# Patient Record
Sex: Male | Born: 1945 | Race: White | Hispanic: No | Marital: Married
Health system: Southern US, Community
[De-identification: ages and names within clinical notes are randomized; demographics above are authoritative.]

---

## 2004-01-18 ENCOUNTER — Emergency Department (HOSPITAL_COMMUNITY): Admission: EM | Admit: 2004-01-18 | Discharge: 2004-01-18 | Payer: Self-pay | Admitting: Emergency Medicine

## 2006-05-15 IMAGING — CT CT NECK W/ CM
1 of 2 series · 9 of 14 positions shown, 12 images · IV contrast (agent unspecified)
Comparison: None.

CLINICAL DATA: Allergic reaction.  Difficulty swallowing after taking amoxicillin today.  

 NECK CT WITH CONTRAST, 01/18/04
TECHNIQUE: Contiguous 3 mm axial images were obtained through the skull base through the aortic arch after administration of 150 cc of nonionic intravenous contrast.

[Series 5: — · axial · 0.39mm/px · z∈[-246,-39]mm · 9 of 87 slices shown, 12 images]
[im 9/87  soft-tissue]
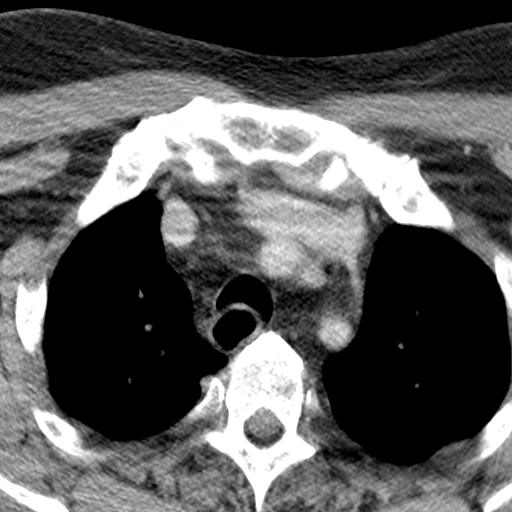
[im 9/87  bone]
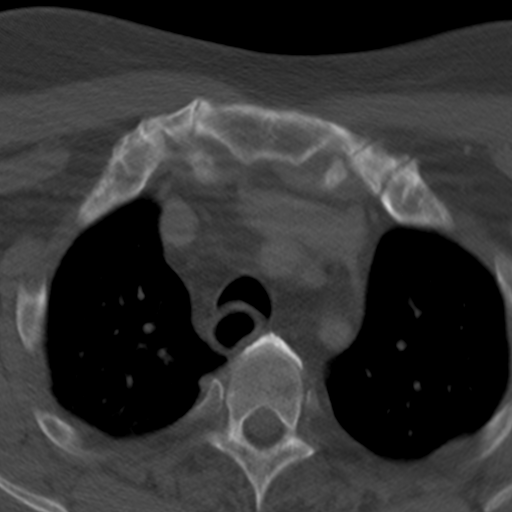
[im 18/87  bone]
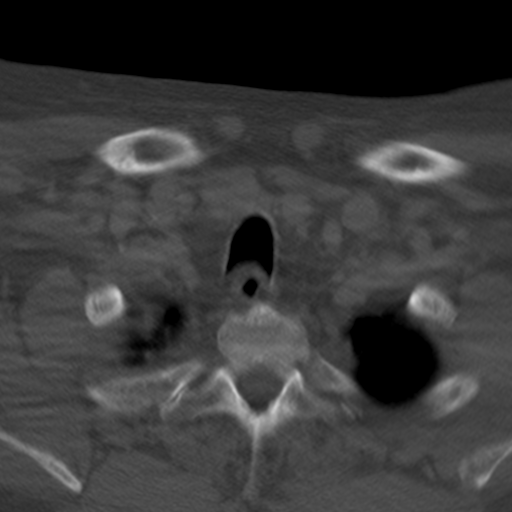
[im 26/87  bone]
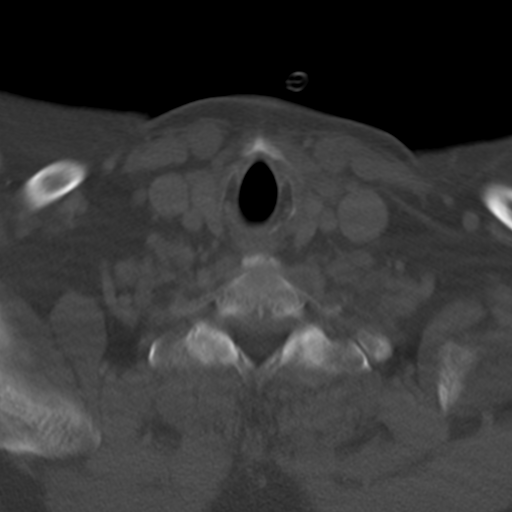
[im 35/87  bone]
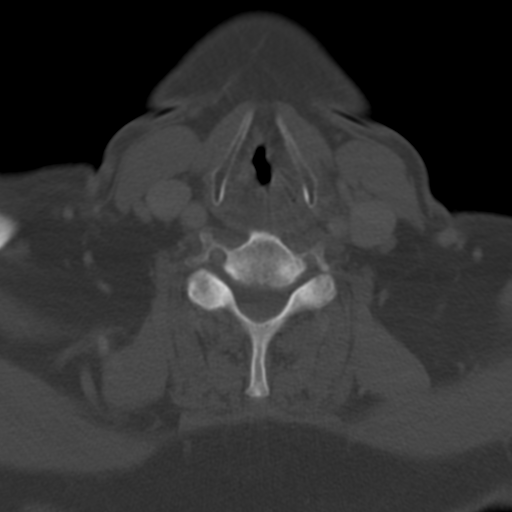
[im 44/87  soft-tissue]
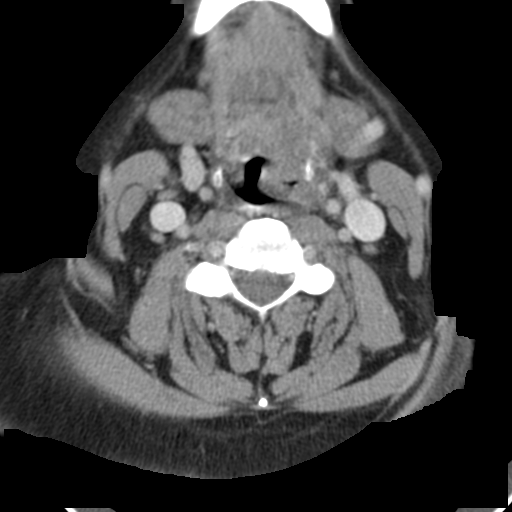
[im 44/87  bone]
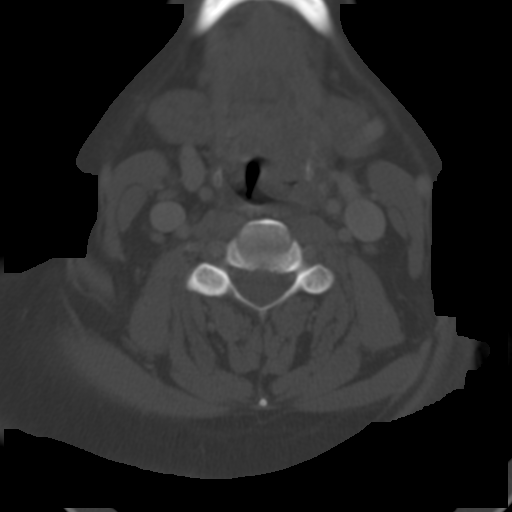
[im 52/87  bone]
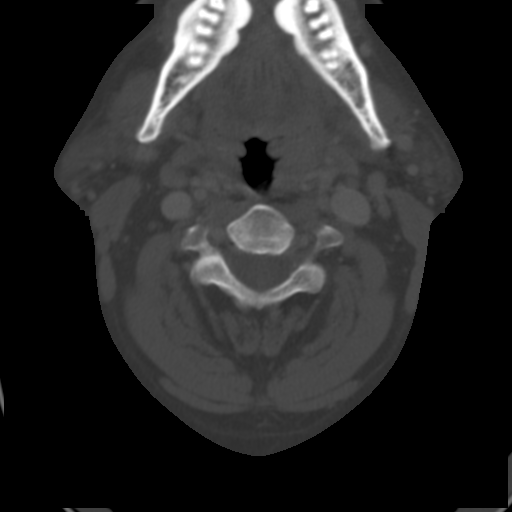
[im 61/87  bone]
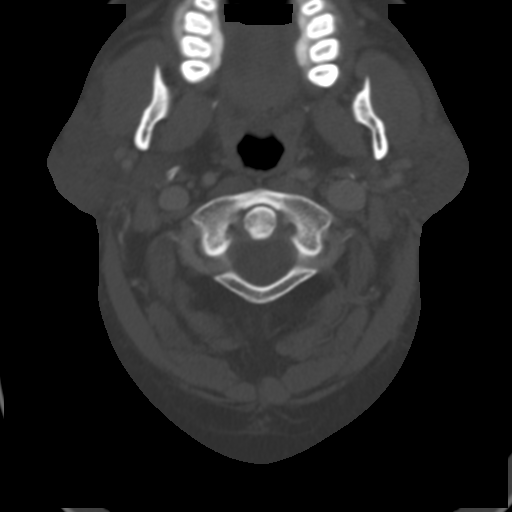
[im 69/87  bone]
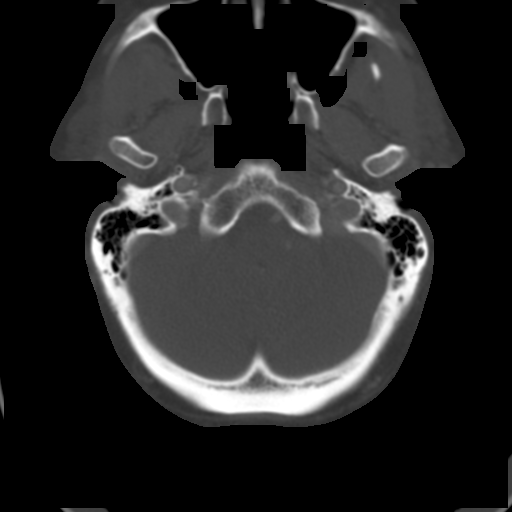
[im 78/87  soft-tissue]
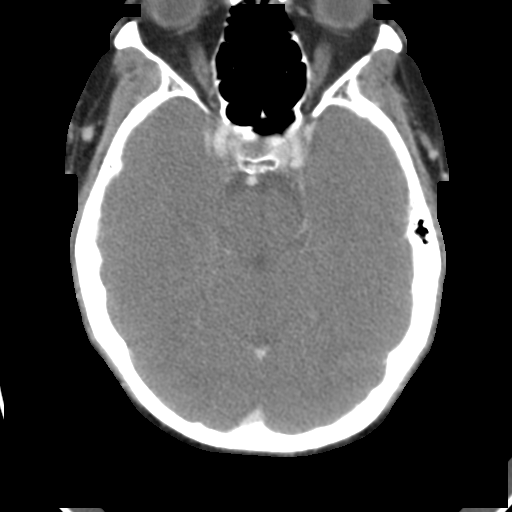
[im 78/87  bone]
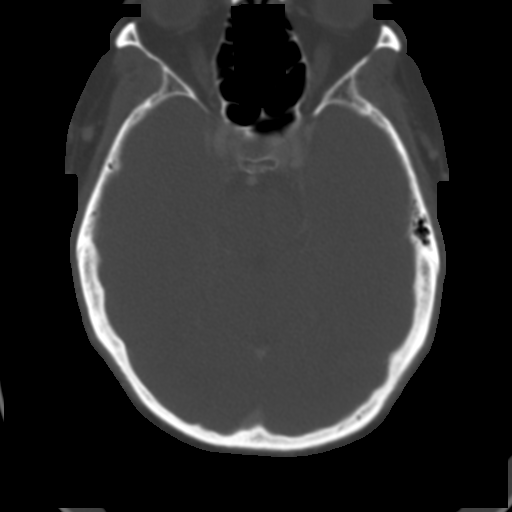

[9 of 14 positions shown; findings below may reference images not displayed]

FINDINGS: There is asymmetry of the oropharyngeal mucosa being more bulky on the left than on the right.  There appears to be a calcification within the left oropharynx just above the level of the epiglottis.  There may be some edema of the epiglottis itself and the aryepiglottic folds appear thickened, left greater than right.  No evidence for a retropharyngeal or parapharyngeal abscess.  No lymphadenopathy is identified in the neck. 

 Motion at the level of the glottis and hypopharyngeal region degrades image quality.
IMPRESSION: Study is limited by motion in the oropharynx and region of the hypopharynx.  However, there does appear to be asymmetric thickening and edema associated with the tissues of the left oropharynx extending inferiorly into the aryepiglottic folds and anterior to the epiglottis itself.  A punctate calcification in the left oropharynx may represent trapped debris but soft tissue calcification is not excluded. 

 [REDACTED]

## 2013-10-26 ENCOUNTER — Other Ambulatory Visit: Payer: Self-pay | Admitting: Family Medicine

## 2013-10-26 DIAGNOSIS — I714 Abdominal aortic aneurysm, without rupture, unspecified: Secondary | ICD-10-CM

## 2013-10-26 DIAGNOSIS — F17200 Nicotine dependence, unspecified, uncomplicated: Secondary | ICD-10-CM

## 2013-11-02 ENCOUNTER — Ambulatory Visit
Admission: RE | Admit: 2013-11-02 | Discharge: 2013-11-02 | Disposition: A | Discharge status: Home / Self Care | Payer: Medicare Other | Source: Ambulatory Visit | Attending: Family Medicine | Admitting: Family Medicine

## 2013-11-02 DIAGNOSIS — I714 Abdominal aortic aneurysm, without rupture, unspecified: Secondary | ICD-10-CM

## 2013-11-02 DIAGNOSIS — F17200 Nicotine dependence, unspecified, uncomplicated: Secondary | ICD-10-CM

## 2013-12-03 ENCOUNTER — Ambulatory Visit
Admission: RE | Admit: 2013-12-03 | Discharge: 2013-12-03 | Disposition: A | Discharge status: Home / Self Care | Payer: Medicare Other | Source: Ambulatory Visit | Attending: Gastroenterology | Admitting: Gastroenterology

## 2013-12-03 ENCOUNTER — Other Ambulatory Visit: Payer: Self-pay | Admitting: Gastroenterology

## 2013-12-03 DIAGNOSIS — R140 Abdominal distension (gaseous): Secondary | ICD-10-CM

## 2016-02-28 IMAGING — US US AORTA SCREENING (MEDICARE)
1 series · 12 of 12 positions shown · non-contrast
Comparison: None.

CLINICAL DATA: Medicare screening exam for abdominal aortic
aneurysm.

EXAM:
ABDOMINAL AORTA SCREENING ULTRASOUND
TECHNIQUE: Ultrasound examination of the abdominal aorta was performed as a
screening evaluation for abdominal aortic aneurysm.

[Series 1: us aorta screening (medicare) · 0.40mm/px · 12 of 12 slices shown]
[im 1/12]
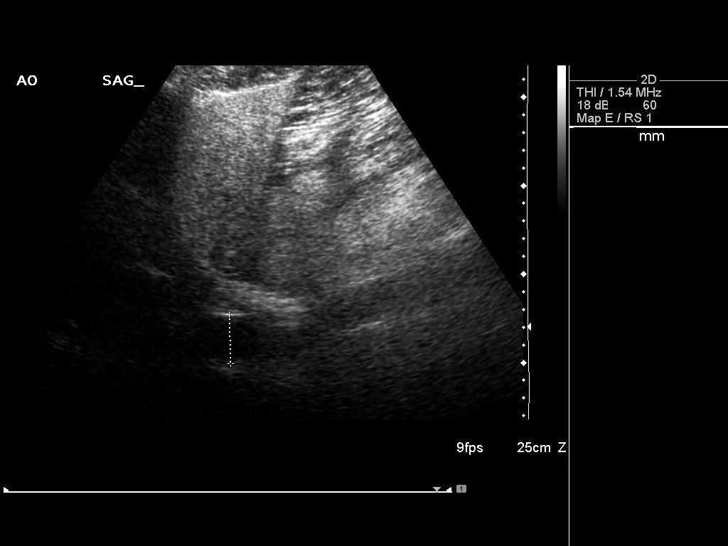
[im 2/12]
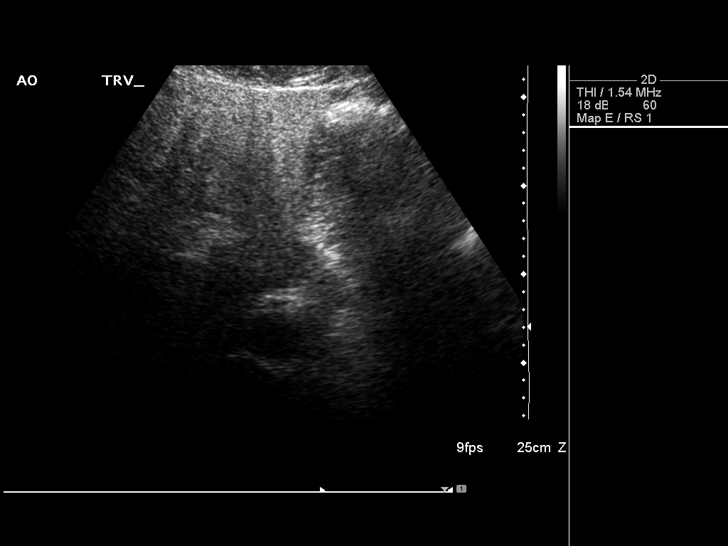
[im 3/12]
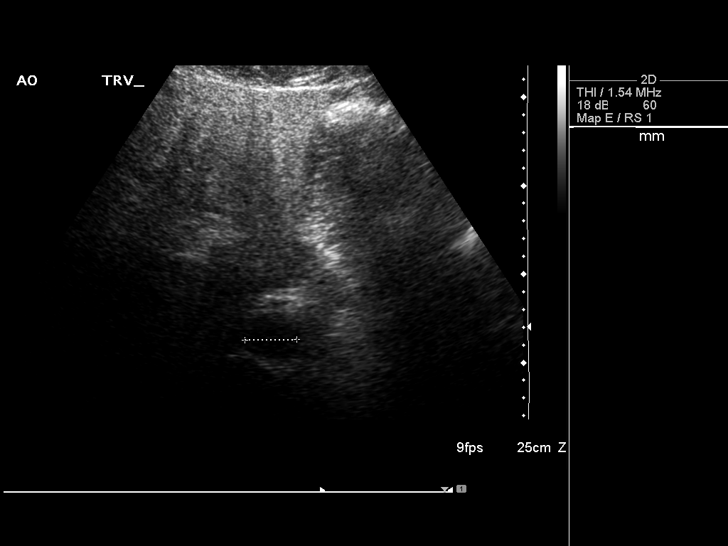
[im 4/12]
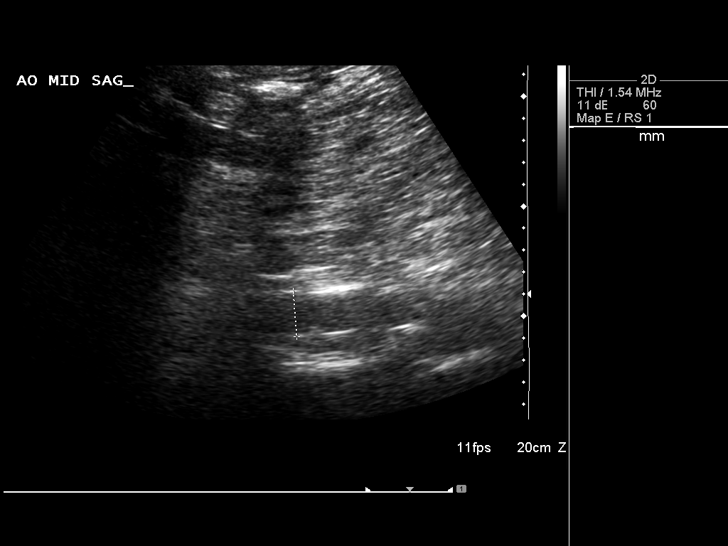
[im 5/12]
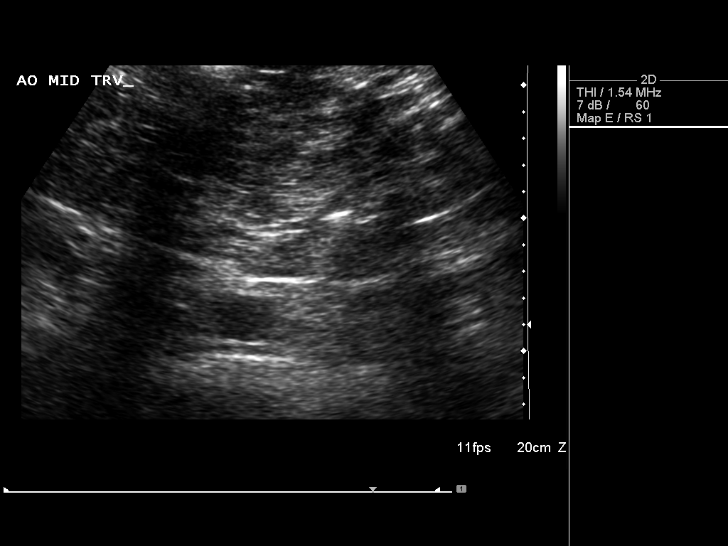
[im 6/12]
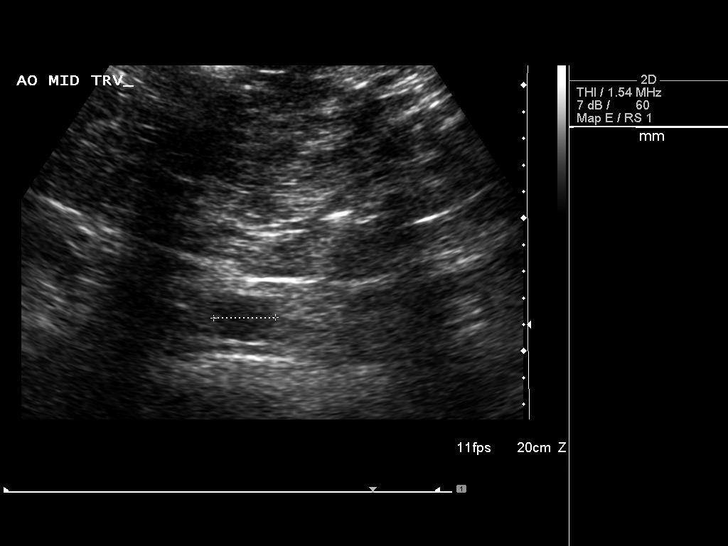
[im 7/12]
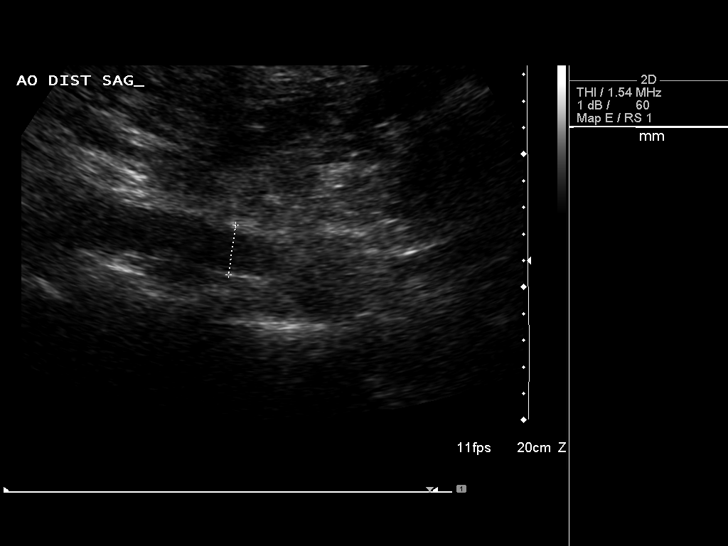
[im 8/12]
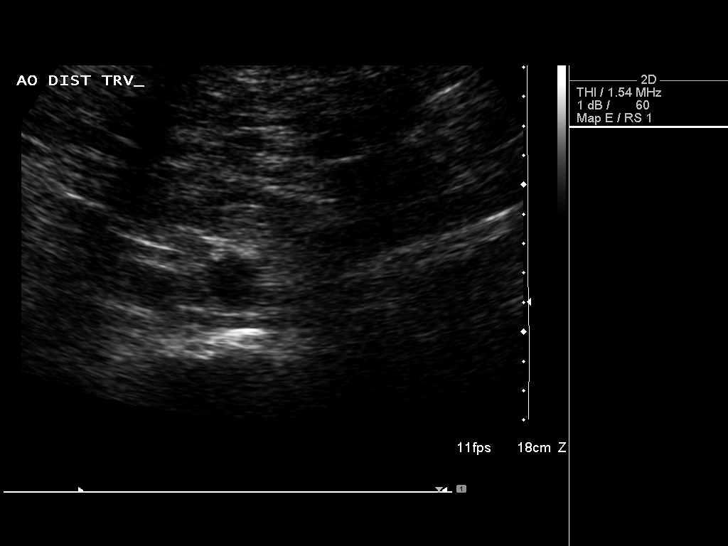
[im 9/12]
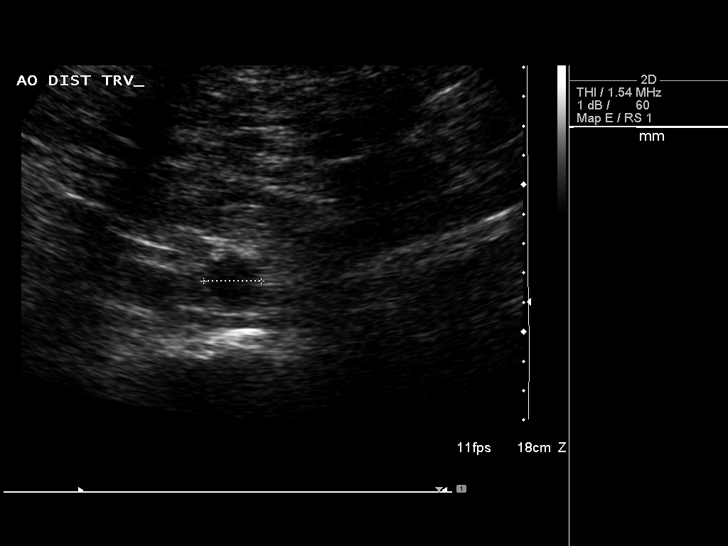
[im 10/12]
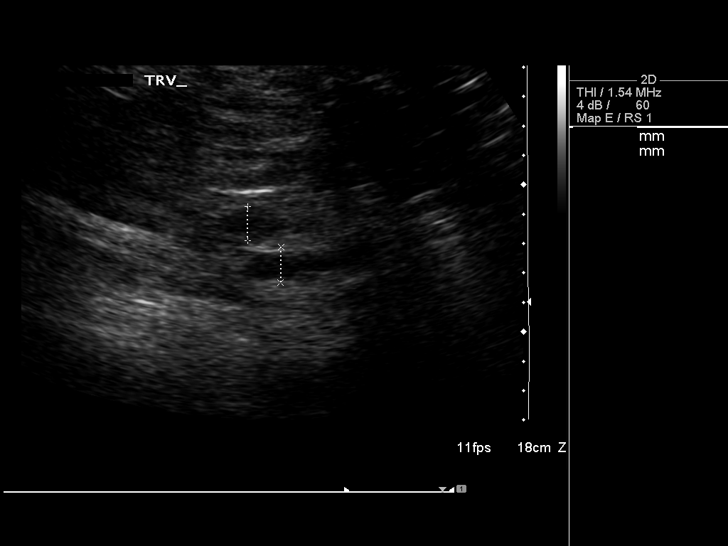
[im 11/12]
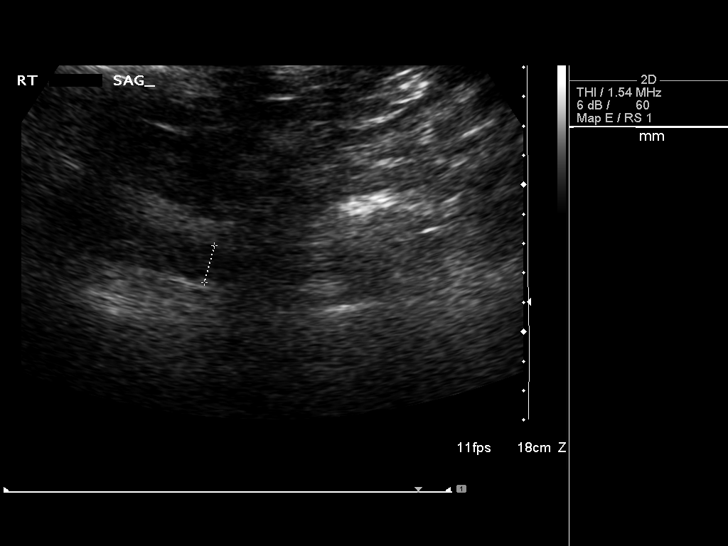
[im 12/12]
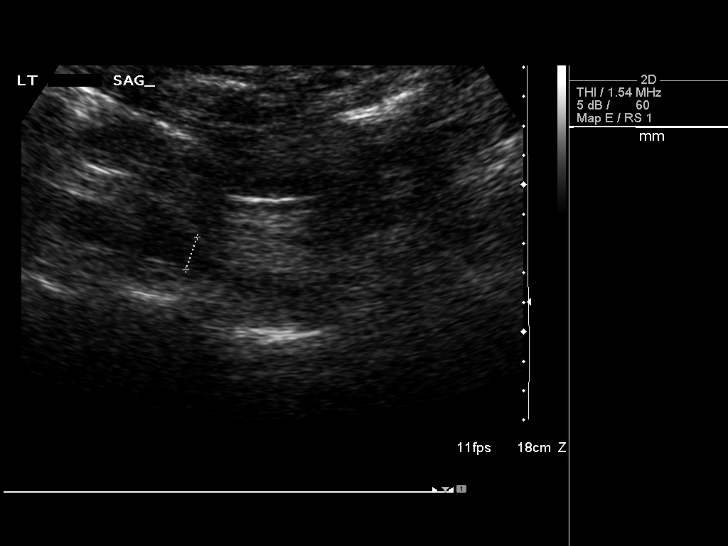

[12 of 12 positions shown; findings below may reference images not displayed]

FINDINGS: Abdominal Aorta

No aneurysm identified.

Maximum AP

Diameter:  2.8 cm in the proximal abdominal aorta.

Maximum TRV

Diameter: 2.9 cm in the proximal abdominal aorta.

The common iliac arteries are within normal limits for caliber.
IMPRESSION: Negative exam.  Abdominal aorta is within normal limits for caliber.

## 2016-11-05 DIAGNOSIS — Z125 Encounter for screening for malignant neoplasm of prostate: Secondary | ICD-10-CM | POA: Diagnosis not present

## 2016-11-05 DIAGNOSIS — Z23 Encounter for immunization: Secondary | ICD-10-CM | POA: Diagnosis not present

## 2016-11-05 DIAGNOSIS — M25561 Pain in right knee: Secondary | ICD-10-CM | POA: Diagnosis not present

## 2016-11-05 DIAGNOSIS — Z1389 Encounter for screening for other disorder: Secondary | ICD-10-CM | POA: Diagnosis not present

## 2016-11-05 DIAGNOSIS — E782 Mixed hyperlipidemia: Secondary | ICD-10-CM | POA: Diagnosis not present

## 2016-11-05 DIAGNOSIS — D696 Thrombocytopenia, unspecified: Secondary | ICD-10-CM | POA: Diagnosis not present

## 2016-11-05 DIAGNOSIS — N529 Male erectile dysfunction, unspecified: Secondary | ICD-10-CM | POA: Diagnosis not present

## 2016-11-05 DIAGNOSIS — Z6834 Body mass index (BMI) 34.0-34.9, adult: Secondary | ICD-10-CM | POA: Diagnosis not present

## 2016-11-05 DIAGNOSIS — Z0000 Encounter for general adult medical examination without abnormal findings: Secondary | ICD-10-CM | POA: Diagnosis not present

## 2016-11-12 DIAGNOSIS — H5213 Myopia, bilateral: Secondary | ICD-10-CM | POA: Diagnosis not present

## 2017-11-10 DIAGNOSIS — Z1389 Encounter for screening for other disorder: Secondary | ICD-10-CM | POA: Diagnosis not present

## 2017-11-10 DIAGNOSIS — E782 Mixed hyperlipidemia: Secondary | ICD-10-CM | POA: Diagnosis not present

## 2017-11-10 DIAGNOSIS — M25561 Pain in right knee: Secondary | ICD-10-CM | POA: Diagnosis not present

## 2017-11-10 DIAGNOSIS — Z6834 Body mass index (BMI) 34.0-34.9, adult: Secondary | ICD-10-CM | POA: Diagnosis not present

## 2017-11-10 DIAGNOSIS — Z125 Encounter for screening for malignant neoplasm of prostate: Secondary | ICD-10-CM | POA: Diagnosis not present

## 2017-11-10 DIAGNOSIS — D696 Thrombocytopenia, unspecified: Secondary | ICD-10-CM | POA: Diagnosis not present

## 2017-11-10 DIAGNOSIS — Z0000 Encounter for general adult medical examination without abnormal findings: Secondary | ICD-10-CM | POA: Diagnosis not present

## 2017-11-10 DIAGNOSIS — N529 Male erectile dysfunction, unspecified: Secondary | ICD-10-CM | POA: Diagnosis not present

## 2017-11-25 DIAGNOSIS — H5213 Myopia, bilateral: Secondary | ICD-10-CM | POA: Diagnosis not present

## 2017-11-25 DIAGNOSIS — H2513 Age-related nuclear cataract, bilateral: Secondary | ICD-10-CM | POA: Diagnosis not present

## 2017-11-25 DIAGNOSIS — H52223 Regular astigmatism, bilateral: Secondary | ICD-10-CM | POA: Diagnosis not present

## 2018-02-02 DIAGNOSIS — H25043 Posterior subcapsular polar age-related cataract, bilateral: Secondary | ICD-10-CM | POA: Diagnosis not present

## 2018-02-02 DIAGNOSIS — H25013 Cortical age-related cataract, bilateral: Secondary | ICD-10-CM | POA: Diagnosis not present

## 2018-02-02 DIAGNOSIS — H52223 Regular astigmatism, bilateral: Secondary | ICD-10-CM | POA: Diagnosis not present

## 2018-02-02 DIAGNOSIS — H2512 Age-related nuclear cataract, left eye: Secondary | ICD-10-CM | POA: Diagnosis not present

## 2018-02-02 DIAGNOSIS — H2513 Age-related nuclear cataract, bilateral: Secondary | ICD-10-CM | POA: Diagnosis not present

## 2018-02-10 DIAGNOSIS — H2512 Age-related nuclear cataract, left eye: Secondary | ICD-10-CM | POA: Diagnosis not present

## 2018-02-10 DIAGNOSIS — H25812 Combined forms of age-related cataract, left eye: Secondary | ICD-10-CM | POA: Diagnosis not present

## 2018-02-11 DIAGNOSIS — H25041 Posterior subcapsular polar age-related cataract, right eye: Secondary | ICD-10-CM | POA: Diagnosis not present

## 2018-02-11 DIAGNOSIS — H2511 Age-related nuclear cataract, right eye: Secondary | ICD-10-CM | POA: Diagnosis not present

## 2018-02-11 DIAGNOSIS — H25011 Cortical age-related cataract, right eye: Secondary | ICD-10-CM | POA: Diagnosis not present

## 2018-03-03 DIAGNOSIS — H2511 Age-related nuclear cataract, right eye: Secondary | ICD-10-CM | POA: Diagnosis not present

## 2018-03-03 DIAGNOSIS — H25811 Combined forms of age-related cataract, right eye: Secondary | ICD-10-CM | POA: Diagnosis not present

## 2018-03-10 DIAGNOSIS — H2511 Age-related nuclear cataract, right eye: Secondary | ICD-10-CM | POA: Diagnosis not present

## 2018-11-12 DIAGNOSIS — Z125 Encounter for screening for malignant neoplasm of prostate: Secondary | ICD-10-CM | POA: Diagnosis not present

## 2018-11-12 DIAGNOSIS — D696 Thrombocytopenia, unspecified: Secondary | ICD-10-CM | POA: Diagnosis not present

## 2018-11-12 DIAGNOSIS — E782 Mixed hyperlipidemia: Secondary | ICD-10-CM | POA: Diagnosis not present

## 2018-11-17 DIAGNOSIS — Z0000 Encounter for general adult medical examination without abnormal findings: Secondary | ICD-10-CM | POA: Diagnosis not present

## 2018-11-17 DIAGNOSIS — N529 Male erectile dysfunction, unspecified: Secondary | ICD-10-CM | POA: Diagnosis not present

## 2018-11-17 DIAGNOSIS — E782 Mixed hyperlipidemia: Secondary | ICD-10-CM | POA: Diagnosis not present

## 2018-11-17 DIAGNOSIS — Z1389 Encounter for screening for other disorder: Secondary | ICD-10-CM | POA: Diagnosis not present

## 2018-11-17 DIAGNOSIS — M25561 Pain in right knee: Secondary | ICD-10-CM | POA: Diagnosis not present

## 2018-11-17 DIAGNOSIS — D696 Thrombocytopenia, unspecified: Secondary | ICD-10-CM | POA: Diagnosis not present

## 2018-11-17 DIAGNOSIS — Z6834 Body mass index (BMI) 34.0-34.9, adult: Secondary | ICD-10-CM | POA: Diagnosis not present

## 2019-01-25 DIAGNOSIS — D485 Neoplasm of uncertain behavior of skin: Secondary | ICD-10-CM | POA: Diagnosis not present

## 2019-01-25 DIAGNOSIS — E7800 Pure hypercholesterolemia, unspecified: Secondary | ICD-10-CM | POA: Diagnosis not present

## 2019-08-12 DIAGNOSIS — R69 Illness, unspecified: Secondary | ICD-10-CM | POA: Diagnosis not present

## 2019-09-07 DIAGNOSIS — R69 Illness, unspecified: Secondary | ICD-10-CM | POA: Diagnosis not present

## 2019-11-22 DIAGNOSIS — G479 Sleep disorder, unspecified: Secondary | ICD-10-CM | POA: Diagnosis not present

## 2019-11-22 DIAGNOSIS — Z125 Encounter for screening for malignant neoplasm of prostate: Secondary | ICD-10-CM | POA: Diagnosis not present

## 2019-11-22 DIAGNOSIS — Z1389 Encounter for screening for other disorder: Secondary | ICD-10-CM | POA: Diagnosis not present

## 2019-11-22 DIAGNOSIS — D696 Thrombocytopenia, unspecified: Secondary | ICD-10-CM | POA: Diagnosis not present

## 2019-11-22 DIAGNOSIS — Z0000 Encounter for general adult medical examination without abnormal findings: Secondary | ICD-10-CM | POA: Diagnosis not present

## 2019-11-22 DIAGNOSIS — N529 Male erectile dysfunction, unspecified: Secondary | ICD-10-CM | POA: Diagnosis not present

## 2019-11-22 DIAGNOSIS — E782 Mixed hyperlipidemia: Secondary | ICD-10-CM | POA: Diagnosis not present

## 2019-11-22 DIAGNOSIS — M25561 Pain in right knee: Secondary | ICD-10-CM | POA: Diagnosis not present

## 2019-12-16 DIAGNOSIS — E875 Hyperkalemia: Secondary | ICD-10-CM | POA: Diagnosis not present

## 2020-11-27 DIAGNOSIS — M25561 Pain in right knee: Secondary | ICD-10-CM | POA: Diagnosis not present

## 2020-11-27 DIAGNOSIS — Z0000 Encounter for general adult medical examination without abnormal findings: Secondary | ICD-10-CM | POA: Diagnosis not present

## 2020-11-27 DIAGNOSIS — G479 Sleep disorder, unspecified: Secondary | ICD-10-CM | POA: Diagnosis not present

## 2020-11-27 DIAGNOSIS — Z1389 Encounter for screening for other disorder: Secondary | ICD-10-CM | POA: Diagnosis not present

## 2020-11-27 DIAGNOSIS — N529 Male erectile dysfunction, unspecified: Secondary | ICD-10-CM | POA: Diagnosis not present

## 2020-11-27 DIAGNOSIS — D696 Thrombocytopenia, unspecified: Secondary | ICD-10-CM | POA: Diagnosis not present

## 2020-11-27 DIAGNOSIS — Z125 Encounter for screening for malignant neoplasm of prostate: Secondary | ICD-10-CM | POA: Diagnosis not present

## 2020-11-27 DIAGNOSIS — E782 Mixed hyperlipidemia: Secondary | ICD-10-CM | POA: Diagnosis not present

## 2021-02-05 DIAGNOSIS — E782 Mixed hyperlipidemia: Secondary | ICD-10-CM | POA: Diagnosis not present

## 2021-04-24 DIAGNOSIS — E782 Mixed hyperlipidemia: Secondary | ICD-10-CM | POA: Diagnosis not present

## 2021-07-10 DIAGNOSIS — E782 Mixed hyperlipidemia: Secondary | ICD-10-CM | POA: Diagnosis not present

## 2021-07-10 DIAGNOSIS — K5904 Chronic idiopathic constipation: Secondary | ICD-10-CM | POA: Diagnosis not present

## 2021-07-10 DIAGNOSIS — H9190 Unspecified hearing loss, unspecified ear: Secondary | ICD-10-CM | POA: Diagnosis not present

## 2021-07-10 DIAGNOSIS — N529 Male erectile dysfunction, unspecified: Secondary | ICD-10-CM | POA: Diagnosis not present

## 2021-07-10 DIAGNOSIS — G259 Extrapyramidal and movement disorder, unspecified: Secondary | ICD-10-CM | POA: Diagnosis not present

## 2021-07-10 DIAGNOSIS — R3589 Other polyuria: Secondary | ICD-10-CM | POA: Diagnosis not present

## 2021-07-10 DIAGNOSIS — B351 Tinea unguium: Secondary | ICD-10-CM | POA: Diagnosis not present

## 2021-07-10 DIAGNOSIS — M48062 Spinal stenosis, lumbar region with neurogenic claudication: Secondary | ICD-10-CM | POA: Diagnosis not present

## 2021-07-13 DIAGNOSIS — M47819 Spondylosis without myelopathy or radiculopathy, site unspecified: Secondary | ICD-10-CM | POA: Diagnosis not present

## 2021-07-13 DIAGNOSIS — M5136 Other intervertebral disc degeneration, lumbar region: Secondary | ICD-10-CM | POA: Diagnosis not present

## 2022-01-02 DIAGNOSIS — Z1331 Encounter for screening for depression: Secondary | ICD-10-CM | POA: Diagnosis not present

## 2022-01-02 DIAGNOSIS — Z0000 Encounter for general adult medical examination without abnormal findings: Secondary | ICD-10-CM | POA: Diagnosis not present

## 2022-01-02 DIAGNOSIS — N529 Male erectile dysfunction, unspecified: Secondary | ICD-10-CM | POA: Diagnosis not present

## 2022-01-02 DIAGNOSIS — E782 Mixed hyperlipidemia: Secondary | ICD-10-CM | POA: Diagnosis not present

## 2022-01-02 DIAGNOSIS — G479 Sleep disorder, unspecified: Secondary | ICD-10-CM | POA: Diagnosis not present

## 2022-01-02 DIAGNOSIS — D696 Thrombocytopenia, unspecified: Secondary | ICD-10-CM | POA: Diagnosis not present

## 2022-01-02 DIAGNOSIS — M25561 Pain in right knee: Secondary | ICD-10-CM | POA: Diagnosis not present

## 2022-01-02 DIAGNOSIS — Z125 Encounter for screening for malignant neoplasm of prostate: Secondary | ICD-10-CM | POA: Diagnosis not present

## 2022-01-02 DIAGNOSIS — Z6839 Body mass index (BMI) 39.0-39.9, adult: Secondary | ICD-10-CM | POA: Diagnosis not present

## 2022-02-21 DIAGNOSIS — N529 Male erectile dysfunction, unspecified: Secondary | ICD-10-CM | POA: Diagnosis not present

## 2022-03-15 DIAGNOSIS — N529 Male erectile dysfunction, unspecified: Secondary | ICD-10-CM | POA: Diagnosis not present

## 2022-03-19 DIAGNOSIS — E7800 Pure hypercholesterolemia, unspecified: Secondary | ICD-10-CM | POA: Diagnosis not present

## 2022-10-25 DIAGNOSIS — R262 Difficulty in walking, not elsewhere classified: Secondary | ICD-10-CM | POA: Diagnosis not present

## 2022-10-25 DIAGNOSIS — M47816 Spondylosis without myelopathy or radiculopathy, lumbar region: Secondary | ICD-10-CM | POA: Diagnosis not present

## 2022-10-25 DIAGNOSIS — M48061 Spinal stenosis, lumbar region without neurogenic claudication: Secondary | ICD-10-CM | POA: Diagnosis not present

## 2022-10-31 DIAGNOSIS — D1801 Hemangioma of skin and subcutaneous tissue: Secondary | ICD-10-CM | POA: Diagnosis not present

## 2022-10-31 DIAGNOSIS — L570 Actinic keratosis: Secondary | ICD-10-CM | POA: Diagnosis not present

## 2022-10-31 DIAGNOSIS — L821 Other seborrheic keratosis: Secondary | ICD-10-CM | POA: Diagnosis not present

## 2022-11-13 DIAGNOSIS — M5459 Other low back pain: Secondary | ICD-10-CM | POA: Diagnosis not present

## 2022-11-13 DIAGNOSIS — M48061 Spinal stenosis, lumbar region without neurogenic claudication: Secondary | ICD-10-CM | POA: Diagnosis not present

## 2022-11-13 DIAGNOSIS — R2689 Other abnormalities of gait and mobility: Secondary | ICD-10-CM | POA: Diagnosis not present

## 2022-11-18 DIAGNOSIS — M5459 Other low back pain: Secondary | ICD-10-CM | POA: Diagnosis not present

## 2022-11-18 DIAGNOSIS — M48061 Spinal stenosis, lumbar region without neurogenic claudication: Secondary | ICD-10-CM | POA: Diagnosis not present

## 2022-11-18 DIAGNOSIS — R2689 Other abnormalities of gait and mobility: Secondary | ICD-10-CM | POA: Diagnosis not present

## 2022-11-21 DIAGNOSIS — M48061 Spinal stenosis, lumbar region without neurogenic claudication: Secondary | ICD-10-CM | POA: Diagnosis not present

## 2022-11-21 DIAGNOSIS — R2689 Other abnormalities of gait and mobility: Secondary | ICD-10-CM | POA: Diagnosis not present

## 2022-11-21 DIAGNOSIS — M5459 Other low back pain: Secondary | ICD-10-CM | POA: Diagnosis not present

## 2022-11-28 DIAGNOSIS — M48061 Spinal stenosis, lumbar region without neurogenic claudication: Secondary | ICD-10-CM | POA: Diagnosis not present

## 2022-11-28 DIAGNOSIS — R2689 Other abnormalities of gait and mobility: Secondary | ICD-10-CM | POA: Diagnosis not present

## 2022-11-28 DIAGNOSIS — M5459 Other low back pain: Secondary | ICD-10-CM | POA: Diagnosis not present

## 2022-12-05 DIAGNOSIS — R2689 Other abnormalities of gait and mobility: Secondary | ICD-10-CM | POA: Diagnosis not present

## 2022-12-05 DIAGNOSIS — M48061 Spinal stenosis, lumbar region without neurogenic claudication: Secondary | ICD-10-CM | POA: Diagnosis not present

## 2022-12-05 DIAGNOSIS — M5459 Other low back pain: Secondary | ICD-10-CM | POA: Diagnosis not present

## 2022-12-12 DIAGNOSIS — M48061 Spinal stenosis, lumbar region without neurogenic claudication: Secondary | ICD-10-CM | POA: Diagnosis not present

## 2022-12-12 DIAGNOSIS — R2689 Other abnormalities of gait and mobility: Secondary | ICD-10-CM | POA: Diagnosis not present

## 2022-12-12 DIAGNOSIS — M5459 Other low back pain: Secondary | ICD-10-CM | POA: Diagnosis not present

## 2022-12-19 DIAGNOSIS — M5459 Other low back pain: Secondary | ICD-10-CM | POA: Diagnosis not present

## 2022-12-19 DIAGNOSIS — M48061 Spinal stenosis, lumbar region without neurogenic claudication: Secondary | ICD-10-CM | POA: Diagnosis not present

## 2022-12-19 DIAGNOSIS — R2689 Other abnormalities of gait and mobility: Secondary | ICD-10-CM | POA: Diagnosis not present

## 2023-01-02 DIAGNOSIS — M5459 Other low back pain: Secondary | ICD-10-CM | POA: Diagnosis not present

## 2023-01-02 DIAGNOSIS — M48061 Spinal stenosis, lumbar region without neurogenic claudication: Secondary | ICD-10-CM | POA: Diagnosis not present

## 2023-01-02 DIAGNOSIS — R2689 Other abnormalities of gait and mobility: Secondary | ICD-10-CM | POA: Diagnosis not present

## 2023-01-07 DIAGNOSIS — M5459 Other low back pain: Secondary | ICD-10-CM | POA: Diagnosis not present

## 2023-01-07 DIAGNOSIS — M48061 Spinal stenosis, lumbar region without neurogenic claudication: Secondary | ICD-10-CM | POA: Diagnosis not present

## 2023-01-07 DIAGNOSIS — R2689 Other abnormalities of gait and mobility: Secondary | ICD-10-CM | POA: Diagnosis not present

## 2023-01-16 DIAGNOSIS — M48061 Spinal stenosis, lumbar region without neurogenic claudication: Secondary | ICD-10-CM | POA: Diagnosis not present

## 2023-01-16 DIAGNOSIS — R2689 Other abnormalities of gait and mobility: Secondary | ICD-10-CM | POA: Diagnosis not present

## 2023-01-16 DIAGNOSIS — M5459 Other low back pain: Secondary | ICD-10-CM | POA: Diagnosis not present

## 2023-01-21 DIAGNOSIS — M5459 Other low back pain: Secondary | ICD-10-CM | POA: Diagnosis not present

## 2023-01-21 DIAGNOSIS — M48061 Spinal stenosis, lumbar region without neurogenic claudication: Secondary | ICD-10-CM | POA: Diagnosis not present

## 2023-01-21 DIAGNOSIS — R2689 Other abnormalities of gait and mobility: Secondary | ICD-10-CM | POA: Diagnosis not present

## 2023-01-28 DIAGNOSIS — Z1159 Encounter for screening for other viral diseases: Secondary | ICD-10-CM | POA: Diagnosis not present

## 2023-01-30 DIAGNOSIS — R2689 Other abnormalities of gait and mobility: Secondary | ICD-10-CM | POA: Diagnosis not present

## 2023-01-30 DIAGNOSIS — M5459 Other low back pain: Secondary | ICD-10-CM | POA: Diagnosis not present

## 2023-01-30 DIAGNOSIS — M48061 Spinal stenosis, lumbar region without neurogenic claudication: Secondary | ICD-10-CM | POA: Diagnosis not present

## 2023-02-04 DIAGNOSIS — M48061 Spinal stenosis, lumbar region without neurogenic claudication: Secondary | ICD-10-CM | POA: Diagnosis not present

## 2023-02-04 DIAGNOSIS — R2689 Other abnormalities of gait and mobility: Secondary | ICD-10-CM | POA: Diagnosis not present

## 2023-02-04 DIAGNOSIS — M5459 Other low back pain: Secondary | ICD-10-CM | POA: Diagnosis not present

## 2023-02-10 DIAGNOSIS — R2689 Other abnormalities of gait and mobility: Secondary | ICD-10-CM | POA: Diagnosis not present

## 2023-02-10 DIAGNOSIS — M48061 Spinal stenosis, lumbar region without neurogenic claudication: Secondary | ICD-10-CM | POA: Diagnosis not present

## 2023-02-10 DIAGNOSIS — M5459 Other low back pain: Secondary | ICD-10-CM | POA: Diagnosis not present

## 2023-02-25 DIAGNOSIS — M5459 Other low back pain: Secondary | ICD-10-CM | POA: Diagnosis not present

## 2023-02-25 DIAGNOSIS — M48061 Spinal stenosis, lumbar region without neurogenic claudication: Secondary | ICD-10-CM | POA: Diagnosis not present

## 2023-02-25 DIAGNOSIS — R2689 Other abnormalities of gait and mobility: Secondary | ICD-10-CM | POA: Diagnosis not present

## 2023-03-05 DIAGNOSIS — E7800 Pure hypercholesterolemia, unspecified: Secondary | ICD-10-CM | POA: Diagnosis not present

## 2023-03-06 DIAGNOSIS — R2689 Other abnormalities of gait and mobility: Secondary | ICD-10-CM | POA: Diagnosis not present

## 2023-03-06 DIAGNOSIS — M5459 Other low back pain: Secondary | ICD-10-CM | POA: Diagnosis not present

## 2023-03-06 DIAGNOSIS — M48061 Spinal stenosis, lumbar region without neurogenic claudication: Secondary | ICD-10-CM | POA: Diagnosis not present

## 2023-03-13 DIAGNOSIS — M48061 Spinal stenosis, lumbar region without neurogenic claudication: Secondary | ICD-10-CM | POA: Diagnosis not present

## 2023-03-13 DIAGNOSIS — M5459 Other low back pain: Secondary | ICD-10-CM | POA: Diagnosis not present

## 2023-03-13 DIAGNOSIS — R2689 Other abnormalities of gait and mobility: Secondary | ICD-10-CM | POA: Diagnosis not present

## 2023-03-27 DIAGNOSIS — R2689 Other abnormalities of gait and mobility: Secondary | ICD-10-CM | POA: Diagnosis not present

## 2023-03-27 DIAGNOSIS — M48061 Spinal stenosis, lumbar region without neurogenic claudication: Secondary | ICD-10-CM | POA: Diagnosis not present

## 2023-03-27 DIAGNOSIS — M5459 Other low back pain: Secondary | ICD-10-CM | POA: Diagnosis not present

## 2023-04-03 DIAGNOSIS — M5459 Other low back pain: Secondary | ICD-10-CM | POA: Diagnosis not present

## 2023-04-03 DIAGNOSIS — M48061 Spinal stenosis, lumbar region without neurogenic claudication: Secondary | ICD-10-CM | POA: Diagnosis not present

## 2023-04-03 DIAGNOSIS — R2689 Other abnormalities of gait and mobility: Secondary | ICD-10-CM | POA: Diagnosis not present

## 2023-04-10 DIAGNOSIS — M5459 Other low back pain: Secondary | ICD-10-CM | POA: Diagnosis not present

## 2023-04-10 DIAGNOSIS — M48061 Spinal stenosis, lumbar region without neurogenic claudication: Secondary | ICD-10-CM | POA: Diagnosis not present

## 2023-04-10 DIAGNOSIS — R2689 Other abnormalities of gait and mobility: Secondary | ICD-10-CM | POA: Diagnosis not present

## 2023-04-17 DIAGNOSIS — M5459 Other low back pain: Secondary | ICD-10-CM | POA: Diagnosis not present

## 2023-04-17 DIAGNOSIS — M48061 Spinal stenosis, lumbar region without neurogenic claudication: Secondary | ICD-10-CM | POA: Diagnosis not present

## 2023-04-17 DIAGNOSIS — R2689 Other abnormalities of gait and mobility: Secondary | ICD-10-CM | POA: Diagnosis not present

## 2023-04-24 DIAGNOSIS — M5459 Other low back pain: Secondary | ICD-10-CM | POA: Diagnosis not present

## 2023-04-24 DIAGNOSIS — M48061 Spinal stenosis, lumbar region without neurogenic claudication: Secondary | ICD-10-CM | POA: Diagnosis not present

## 2023-04-24 DIAGNOSIS — R2689 Other abnormalities of gait and mobility: Secondary | ICD-10-CM | POA: Diagnosis not present

## 2023-04-30 DIAGNOSIS — M48061 Spinal stenosis, lumbar region without neurogenic claudication: Secondary | ICD-10-CM | POA: Diagnosis not present

## 2023-04-30 DIAGNOSIS — R2689 Other abnormalities of gait and mobility: Secondary | ICD-10-CM | POA: Diagnosis not present

## 2023-04-30 DIAGNOSIS — M5459 Other low back pain: Secondary | ICD-10-CM | POA: Diagnosis not present

## 2023-05-08 DIAGNOSIS — R2689 Other abnormalities of gait and mobility: Secondary | ICD-10-CM | POA: Diagnosis not present

## 2023-05-08 DIAGNOSIS — M48061 Spinal stenosis, lumbar region without neurogenic claudication: Secondary | ICD-10-CM | POA: Diagnosis not present

## 2023-05-08 DIAGNOSIS — M5459 Other low back pain: Secondary | ICD-10-CM | POA: Diagnosis not present

## 2023-05-15 DIAGNOSIS — M48061 Spinal stenosis, lumbar region without neurogenic claudication: Secondary | ICD-10-CM | POA: Diagnosis not present

## 2023-05-15 DIAGNOSIS — M5459 Other low back pain: Secondary | ICD-10-CM | POA: Diagnosis not present

## 2023-05-15 DIAGNOSIS — R2689 Other abnormalities of gait and mobility: Secondary | ICD-10-CM | POA: Diagnosis not present

## 2023-05-22 DIAGNOSIS — M48061 Spinal stenosis, lumbar region without neurogenic claudication: Secondary | ICD-10-CM | POA: Diagnosis not present

## 2023-05-22 DIAGNOSIS — M5459 Other low back pain: Secondary | ICD-10-CM | POA: Diagnosis not present

## 2023-05-22 DIAGNOSIS — R2689 Other abnormalities of gait and mobility: Secondary | ICD-10-CM | POA: Diagnosis not present

## 2023-05-29 DIAGNOSIS — M5459 Other low back pain: Secondary | ICD-10-CM | POA: Diagnosis not present

## 2023-05-29 DIAGNOSIS — M48061 Spinal stenosis, lumbar region without neurogenic claudication: Secondary | ICD-10-CM | POA: Diagnosis not present

## 2023-05-29 DIAGNOSIS — R2689 Other abnormalities of gait and mobility: Secondary | ICD-10-CM | POA: Diagnosis not present

## 2023-06-05 DIAGNOSIS — R2689 Other abnormalities of gait and mobility: Secondary | ICD-10-CM | POA: Diagnosis not present

## 2023-06-05 DIAGNOSIS — M48061 Spinal stenosis, lumbar region without neurogenic claudication: Secondary | ICD-10-CM | POA: Diagnosis not present

## 2023-06-05 DIAGNOSIS — M5459 Other low back pain: Secondary | ICD-10-CM | POA: Diagnosis not present

## 2023-06-12 DIAGNOSIS — M48061 Spinal stenosis, lumbar region without neurogenic claudication: Secondary | ICD-10-CM | POA: Diagnosis not present

## 2023-06-12 DIAGNOSIS — R2689 Other abnormalities of gait and mobility: Secondary | ICD-10-CM | POA: Diagnosis not present

## 2023-06-12 DIAGNOSIS — M5459 Other low back pain: Secondary | ICD-10-CM | POA: Diagnosis not present

## 2023-06-18 DIAGNOSIS — R2689 Other abnormalities of gait and mobility: Secondary | ICD-10-CM | POA: Diagnosis not present

## 2023-06-18 DIAGNOSIS — M48061 Spinal stenosis, lumbar region without neurogenic claudication: Secondary | ICD-10-CM | POA: Diagnosis not present

## 2023-06-18 DIAGNOSIS — M5459 Other low back pain: Secondary | ICD-10-CM | POA: Diagnosis not present

## 2023-06-25 DIAGNOSIS — M48061 Spinal stenosis, lumbar region without neurogenic claudication: Secondary | ICD-10-CM | POA: Diagnosis not present

## 2023-06-25 DIAGNOSIS — R2689 Other abnormalities of gait and mobility: Secondary | ICD-10-CM | POA: Diagnosis not present

## 2023-06-25 DIAGNOSIS — M5459 Other low back pain: Secondary | ICD-10-CM | POA: Diagnosis not present

## 2023-07-03 DIAGNOSIS — R2689 Other abnormalities of gait and mobility: Secondary | ICD-10-CM | POA: Diagnosis not present

## 2023-07-03 DIAGNOSIS — M48061 Spinal stenosis, lumbar region without neurogenic claudication: Secondary | ICD-10-CM | POA: Diagnosis not present

## 2023-07-03 DIAGNOSIS — M5459 Other low back pain: Secondary | ICD-10-CM | POA: Diagnosis not present

## 2023-07-10 DIAGNOSIS — M48061 Spinal stenosis, lumbar region without neurogenic claudication: Secondary | ICD-10-CM | POA: Diagnosis not present

## 2023-07-10 DIAGNOSIS — R2689 Other abnormalities of gait and mobility: Secondary | ICD-10-CM | POA: Diagnosis not present

## 2023-07-10 DIAGNOSIS — M5459 Other low back pain: Secondary | ICD-10-CM | POA: Diagnosis not present

## 2023-07-17 DIAGNOSIS — R2689 Other abnormalities of gait and mobility: Secondary | ICD-10-CM | POA: Diagnosis not present

## 2023-07-17 DIAGNOSIS — M5459 Other low back pain: Secondary | ICD-10-CM | POA: Diagnosis not present

## 2023-07-17 DIAGNOSIS — M48061 Spinal stenosis, lumbar region without neurogenic claudication: Secondary | ICD-10-CM | POA: Diagnosis not present

## 2023-07-24 DIAGNOSIS — M5459 Other low back pain: Secondary | ICD-10-CM | POA: Diagnosis not present

## 2023-07-24 DIAGNOSIS — M48061 Spinal stenosis, lumbar region without neurogenic claudication: Secondary | ICD-10-CM | POA: Diagnosis not present

## 2023-07-24 DIAGNOSIS — R2689 Other abnormalities of gait and mobility: Secondary | ICD-10-CM | POA: Diagnosis not present

## 2023-07-31 DIAGNOSIS — R2689 Other abnormalities of gait and mobility: Secondary | ICD-10-CM | POA: Diagnosis not present

## 2023-07-31 DIAGNOSIS — M5459 Other low back pain: Secondary | ICD-10-CM | POA: Diagnosis not present

## 2023-07-31 DIAGNOSIS — M48061 Spinal stenosis, lumbar region without neurogenic claudication: Secondary | ICD-10-CM | POA: Diagnosis not present

## 2023-08-07 DIAGNOSIS — M5459 Other low back pain: Secondary | ICD-10-CM | POA: Diagnosis not present

## 2023-08-07 DIAGNOSIS — M48061 Spinal stenosis, lumbar region without neurogenic claudication: Secondary | ICD-10-CM | POA: Diagnosis not present

## 2023-08-07 DIAGNOSIS — R2689 Other abnormalities of gait and mobility: Secondary | ICD-10-CM | POA: Diagnosis not present

## 2023-08-14 DIAGNOSIS — R2689 Other abnormalities of gait and mobility: Secondary | ICD-10-CM | POA: Diagnosis not present

## 2023-08-14 DIAGNOSIS — M48061 Spinal stenosis, lumbar region without neurogenic claudication: Secondary | ICD-10-CM | POA: Diagnosis not present

## 2023-08-14 DIAGNOSIS — M5459 Other low back pain: Secondary | ICD-10-CM | POA: Diagnosis not present

## 2023-08-21 DIAGNOSIS — R2689 Other abnormalities of gait and mobility: Secondary | ICD-10-CM | POA: Diagnosis not present

## 2023-08-21 DIAGNOSIS — M48061 Spinal stenosis, lumbar region without neurogenic claudication: Secondary | ICD-10-CM | POA: Diagnosis not present

## 2023-08-21 DIAGNOSIS — M5459 Other low back pain: Secondary | ICD-10-CM | POA: Diagnosis not present

## 2023-08-28 DIAGNOSIS — M5459 Other low back pain: Secondary | ICD-10-CM | POA: Diagnosis not present

## 2023-08-28 DIAGNOSIS — M48061 Spinal stenosis, lumbar region without neurogenic claudication: Secondary | ICD-10-CM | POA: Diagnosis not present

## 2023-08-28 DIAGNOSIS — R2689 Other abnormalities of gait and mobility: Secondary | ICD-10-CM | POA: Diagnosis not present

## 2023-09-04 DIAGNOSIS — M5459 Other low back pain: Secondary | ICD-10-CM | POA: Diagnosis not present

## 2023-09-04 DIAGNOSIS — M48061 Spinal stenosis, lumbar region without neurogenic claudication: Secondary | ICD-10-CM | POA: Diagnosis not present

## 2023-09-04 DIAGNOSIS — R2689 Other abnormalities of gait and mobility: Secondary | ICD-10-CM | POA: Diagnosis not present

## 2023-09-18 DIAGNOSIS — M48061 Spinal stenosis, lumbar region without neurogenic claudication: Secondary | ICD-10-CM | POA: Diagnosis not present

## 2023-09-18 DIAGNOSIS — R2689 Other abnormalities of gait and mobility: Secondary | ICD-10-CM | POA: Diagnosis not present

## 2023-09-18 DIAGNOSIS — M5459 Other low back pain: Secondary | ICD-10-CM | POA: Diagnosis not present

## 2023-09-24 DIAGNOSIS — R2689 Other abnormalities of gait and mobility: Secondary | ICD-10-CM | POA: Diagnosis not present

## 2023-09-24 DIAGNOSIS — M48061 Spinal stenosis, lumbar region without neurogenic claudication: Secondary | ICD-10-CM | POA: Diagnosis not present

## 2023-09-24 DIAGNOSIS — M5459 Other low back pain: Secondary | ICD-10-CM | POA: Diagnosis not present

## 2023-10-09 DIAGNOSIS — M48061 Spinal stenosis, lumbar region without neurogenic claudication: Secondary | ICD-10-CM | POA: Diagnosis not present

## 2023-10-09 DIAGNOSIS — M5459 Other low back pain: Secondary | ICD-10-CM | POA: Diagnosis not present

## 2023-10-09 DIAGNOSIS — R2689 Other abnormalities of gait and mobility: Secondary | ICD-10-CM | POA: Diagnosis not present

## 2023-10-23 DIAGNOSIS — R2689 Other abnormalities of gait and mobility: Secondary | ICD-10-CM | POA: Diagnosis not present

## 2023-10-23 DIAGNOSIS — M5459 Other low back pain: Secondary | ICD-10-CM | POA: Diagnosis not present

## 2023-10-23 DIAGNOSIS — M48061 Spinal stenosis, lumbar region without neurogenic claudication: Secondary | ICD-10-CM | POA: Diagnosis not present

## 2023-10-30 DIAGNOSIS — M48061 Spinal stenosis, lumbar region without neurogenic claudication: Secondary | ICD-10-CM | POA: Diagnosis not present

## 2023-10-30 DIAGNOSIS — M5459 Other low back pain: Secondary | ICD-10-CM | POA: Diagnosis not present

## 2023-10-30 DIAGNOSIS — R2689 Other abnormalities of gait and mobility: Secondary | ICD-10-CM | POA: Diagnosis not present

## 2023-11-06 DIAGNOSIS — M48061 Spinal stenosis, lumbar region without neurogenic claudication: Secondary | ICD-10-CM | POA: Diagnosis not present

## 2023-11-06 DIAGNOSIS — R2689 Other abnormalities of gait and mobility: Secondary | ICD-10-CM | POA: Diagnosis not present

## 2023-11-06 DIAGNOSIS — M5459 Other low back pain: Secondary | ICD-10-CM | POA: Diagnosis not present

## 2023-11-13 DIAGNOSIS — R2689 Other abnormalities of gait and mobility: Secondary | ICD-10-CM | POA: Diagnosis not present

## 2023-11-13 DIAGNOSIS — M5459 Other low back pain: Secondary | ICD-10-CM | POA: Diagnosis not present

## 2023-11-13 DIAGNOSIS — M48061 Spinal stenosis, lumbar region without neurogenic claudication: Secondary | ICD-10-CM | POA: Diagnosis not present

## 2023-11-20 DIAGNOSIS — M48061 Spinal stenosis, lumbar region without neurogenic claudication: Secondary | ICD-10-CM | POA: Diagnosis not present

## 2023-11-20 DIAGNOSIS — M5459 Other low back pain: Secondary | ICD-10-CM | POA: Diagnosis not present

## 2023-11-20 DIAGNOSIS — R2689 Other abnormalities of gait and mobility: Secondary | ICD-10-CM | POA: Diagnosis not present

## 2023-11-26 DIAGNOSIS — M5459 Other low back pain: Secondary | ICD-10-CM | POA: Diagnosis not present

## 2023-11-26 DIAGNOSIS — M48061 Spinal stenosis, lumbar region without neurogenic claudication: Secondary | ICD-10-CM | POA: Diagnosis not present

## 2023-11-26 DIAGNOSIS — R2689 Other abnormalities of gait and mobility: Secondary | ICD-10-CM | POA: Diagnosis not present

## 2023-12-04 DIAGNOSIS — R2689 Other abnormalities of gait and mobility: Secondary | ICD-10-CM | POA: Diagnosis not present

## 2023-12-04 DIAGNOSIS — M5459 Other low back pain: Secondary | ICD-10-CM | POA: Diagnosis not present

## 2023-12-04 DIAGNOSIS — M48061 Spinal stenosis, lumbar region without neurogenic claudication: Secondary | ICD-10-CM | POA: Diagnosis not present

## 2023-12-11 DIAGNOSIS — R2689 Other abnormalities of gait and mobility: Secondary | ICD-10-CM | POA: Diagnosis not present

## 2023-12-11 DIAGNOSIS — M5459 Other low back pain: Secondary | ICD-10-CM | POA: Diagnosis not present

## 2023-12-11 DIAGNOSIS — M48061 Spinal stenosis, lumbar region without neurogenic claudication: Secondary | ICD-10-CM | POA: Diagnosis not present

## 2023-12-18 DIAGNOSIS — M48061 Spinal stenosis, lumbar region without neurogenic claudication: Secondary | ICD-10-CM | POA: Diagnosis not present

## 2023-12-18 DIAGNOSIS — R2689 Other abnormalities of gait and mobility: Secondary | ICD-10-CM | POA: Diagnosis not present

## 2023-12-18 DIAGNOSIS — M5459 Other low back pain: Secondary | ICD-10-CM | POA: Diagnosis not present

## 2023-12-24 DIAGNOSIS — R2689 Other abnormalities of gait and mobility: Secondary | ICD-10-CM | POA: Diagnosis not present

## 2023-12-24 DIAGNOSIS — M48061 Spinal stenosis, lumbar region without neurogenic claudication: Secondary | ICD-10-CM | POA: Diagnosis not present

## 2023-12-24 DIAGNOSIS — M5459 Other low back pain: Secondary | ICD-10-CM | POA: Diagnosis not present

## 2024-01-01 DIAGNOSIS — M5459 Other low back pain: Secondary | ICD-10-CM | POA: Diagnosis not present

## 2024-01-01 DIAGNOSIS — R2689 Other abnormalities of gait and mobility: Secondary | ICD-10-CM | POA: Diagnosis not present

## 2024-01-01 DIAGNOSIS — M48061 Spinal stenosis, lumbar region without neurogenic claudication: Secondary | ICD-10-CM | POA: Diagnosis not present

## 2024-01-15 DIAGNOSIS — M5459 Other low back pain: Secondary | ICD-10-CM | POA: Diagnosis not present

## 2024-01-15 DIAGNOSIS — M48061 Spinal stenosis, lumbar region without neurogenic claudication: Secondary | ICD-10-CM | POA: Diagnosis not present

## 2024-01-15 DIAGNOSIS — R2689 Other abnormalities of gait and mobility: Secondary | ICD-10-CM | POA: Diagnosis not present

## 2024-01-20 DIAGNOSIS — M48061 Spinal stenosis, lumbar region without neurogenic claudication: Secondary | ICD-10-CM | POA: Diagnosis not present

## 2024-01-20 DIAGNOSIS — R2689 Other abnormalities of gait and mobility: Secondary | ICD-10-CM | POA: Diagnosis not present

## 2024-01-20 DIAGNOSIS — M5459 Other low back pain: Secondary | ICD-10-CM | POA: Diagnosis not present

## 2024-01-29 DIAGNOSIS — R2689 Other abnormalities of gait and mobility: Secondary | ICD-10-CM | POA: Diagnosis not present

## 2024-01-29 DIAGNOSIS — M5459 Other low back pain: Secondary | ICD-10-CM | POA: Diagnosis not present

## 2024-01-29 DIAGNOSIS — M48061 Spinal stenosis, lumbar region without neurogenic claudication: Secondary | ICD-10-CM | POA: Diagnosis not present

## 2024-02-12 DIAGNOSIS — R2689 Other abnormalities of gait and mobility: Secondary | ICD-10-CM | POA: Diagnosis not present

## 2024-02-12 DIAGNOSIS — M48061 Spinal stenosis, lumbar region without neurogenic claudication: Secondary | ICD-10-CM | POA: Diagnosis not present

## 2024-02-12 DIAGNOSIS — M5459 Other low back pain: Secondary | ICD-10-CM | POA: Diagnosis not present
